# Patient Record
Sex: Female | Born: 2005 | Race: White | Hispanic: No | Marital: Single | Smoking: Never smoker
Health system: Southern US, Community
[De-identification: ages and names within clinical notes are randomized; demographics above are authoritative.]

## PROBLEM LIST (undated history)

## (undated) DIAGNOSIS — R011 Cardiac murmur, unspecified: Secondary | ICD-10-CM

## (undated) HISTORY — DX: Cardiac murmur, unspecified: R01.1

---
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
## 2024-05-15 ENCOUNTER — Ambulatory Visit (HOSPITAL_BASED_OUTPATIENT_CLINIC_OR_DEPARTMENT_OTHER): Admitting: Cardiology
